# Patient Record
Sex: Male | Born: 1995 | Race: White | Hispanic: No | Marital: Single | Smoking: Never smoker
Health system: Southern US, Community
[De-identification: ages and names within clinical notes are randomized; demographics above are authoritative.]

---
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
## 2018-11-24 ENCOUNTER — Ambulatory Visit
Admission: EM | Admit: 2018-11-24 | Discharge: 2018-11-24 | Disposition: A | Discharge status: Home / Self Care | Payer: BLUE CROSS/BLUE SHIELD | Attending: Family Medicine | Admitting: Family Medicine

## 2018-11-24 DIAGNOSIS — Z7251 High risk heterosexual behavior: Secondary | ICD-10-CM

## 2018-11-24 DIAGNOSIS — Z113 Encounter for screening for infections with a predominantly sexual mode of transmission: Secondary | ICD-10-CM | POA: Diagnosis not present

## 2018-11-24 LAB — URINALYSIS, COMPLETE (UACMP) WITH MICROSCOPIC
Bacteria, UA: NONE SEEN
Bilirubin Urine: NEGATIVE
Glucose, UA: NEGATIVE mg/dL
Hgb urine dipstick: NEGATIVE
Ketones, ur: NEGATIVE mg/dL
Leukocytes,Ua: NEGATIVE
Nitrite: NEGATIVE
Protein, ur: NEGATIVE mg/dL
RBC / HPF: NONE SEEN RBC/hpf (ref 0–5)
Specific Gravity, Urine: 1.02 (ref 1.005–1.030)
WBC, UA: NONE SEEN WBC/hpf (ref 0–5)
pH: 7 (ref 5.0–8.0)

## 2018-11-24 MED ORDER — CEFTRIAXONE SODIUM 250 MG IJ SOLR
250.0000 mg | Freq: Once | INTRAMUSCULAR | Status: AC
  Administered 2018-11-24: 18:00:00 250 mg via INTRAMUSCULAR

## 2018-11-24 MED ORDER — DOXYCYCLINE HYCLATE 100 MG PO TABS: 100.0000 mg | ORAL_TABLET | Freq: Two times a day (BID) | ORAL | 0 refills | Status: AC

## 2018-11-24 NOTE — ED Triage Notes (Signed)
Pt here for std testing, has some itching of his penis along with "something" located on his penis. No urinary symptoms reported but did have unprotected sex 1 day ago and wanted to get tested. No drainage

## 2018-11-24 NOTE — ED Provider Notes (Signed)
[facility]    CSN: [id number] Arrival date & time: 11/24/18  1651      History   Chief Complaint Chief Complaint  Patient presents with  . SEXUALLY TRANSMITTED DISEASE    HPI Edward Sloan. is a 23 y.o. male.   23 yo male here for std screening after high risk sexual behavior. States he went to a "strip club" at [employer] this past weekend and had unprotected sex with several strangers. Denies any penile discharge, dysuria, hematuria.       History reviewed. No pertinent past medical history.  There are no active problems to display for this patient.   History reviewed. No pertinent surgical history.     Home Medications    Prior to Admission medications   Medication Sig Start Date End Date Taking? Authorizing Provider  doxycycline (VIBRA-TABS) 100 MG tablet Take 1 tablet (100 mg total) by mouth 2 (two) times daily. 11/24/18   Norval Gable, MD    Family History Family History  Problem Relation Age of Onset  . Healthy Mother   . Healthy Father     Social History Social History   Tobacco Use  . Smoking status: Never Smoker  . Smokeless tobacco: Never Used  Substance Use Topics  . Alcohol use: Yes  . Drug use: Never     Allergies   Azithromycin   Review of Systems Review of Systems   Physical Exam Triage Vital Signs ED Triage Vitals  Enc Vitals Group     BP 11/24/18 1710 (!) 143/87     Pulse Rate 11/24/18 1710 90     Resp 11/24/18 1710 18     Temp 11/24/18 1710 98.2 F (36.8 C)     Temp Source 11/24/18 1710 Oral     SpO2 11/24/18 1710 98 %     Weight 11/24/18 1711 125 lb (56.7 kg)     Height --      Head Circumference --      Peak Flow --      Pain Score 11/24/18 1711 0     Pain Loc --      Pain Edu? --      Excl. in [place]? --    No data found.  Updated Vital Signs BP (!) 143/87 (BP Location: Left Arm)   Pulse 90   Temp 98.2 F (36.8 C) (Oral)   Resp 18   Wt 56.7 kg   SpO2 98%   Visual Acuity Right Eye  Distance:   Left Eye Distance:   Bilateral Distance:    Right Eye Near:   Left Eye Near:    Bilateral Near:     Physical Exam Vitals signs and nursing note reviewed.  Constitutional:      General: He is not in acute distress.    Appearance: He is not toxic-appearing or diaphoretic.  Genitourinary:    Pubic Area: No rash.      Penis: Normal.      Scrotum/Testes: Normal.  Neurological:     Mental Status: He is alert.      UC Treatments / Results  Labs (all labs ordered are listed, but only abnormal results are displayed) Labs Reviewed  URINALYSIS, COMPLETE (UACMP) WITH MICROSCOPIC - Abnormal; Notable for the following components:      Result Value   Color, Urine STRAW (*)    All other components within normal limits  GC/CHLAMYDIA PROBE AMP  RPR  HIV ANTIBODY (ROUTINE TESTING W REFLEX)  EKG   Radiology No results found.  Procedures Procedures (including critical care time)  Medications Ordered in UC Medications  cefTRIAXone (ROCEPHIN) injection 250 mg (250 mg Intramuscular Given 11/24/18 1811)    Initial Impression / Assessment and Plan / UC Course  I have reviewed the triage vital signs and the nursing notes.  Pertinent labs & imaging results that were available during my care of the patient were reviewed by me and considered in my medical decision making (see chart for details).      Final Clinical Impressions(s) / UC Diagnoses   Final diagnoses:  Screening examination for STD (sexually transmitted disease)  High risk sexual behavior, unspecified type    ED Prescriptions    Medication Sig Dispense Auth. Provider   doxycycline (VIBRA-TABS) 100 MG tablet Take 1 tablet (100 mg total) by mouth 2 (two) times daily. 14 tablet Shermaine Rivet, Pamala Hurryrlando, MD      1. diagnosis reviewed with patient 2. rx as per orders above; reviewed possible side effects, interactions, risks and benefits  3. Given Rocephin 250mg  IM x 1 4. Labs as per orders (await test results)  5. Follow-up prn   Controlled Substance Prescriptions [place] Controlled Substance Registry consulted? Not Applicable   Payton Mccallumonty, Estus Krakowski, MD 11/24/18 [phone]

## 2018-11-25 LAB — RPR: RPR Ser Ql: NONREACTIVE

## 2018-11-26 LAB — HIV ANTIBODY (ROUTINE TESTING W REFLEX): HIV Screen 4th Generation wRfx: NONREACTIVE

## 2018-11-27 LAB — GC/CHLAMYDIA PROBE AMP
Chlamydia trachomatis, NAA: NEGATIVE
Neisseria Gonorrhoeae by PCR: NEGATIVE

## 2021-01-31 ENCOUNTER — Ambulatory Visit: Payer: Self-pay

## 2021-01-31 ENCOUNTER — Other Ambulatory Visit: Payer: Self-pay

## 2021-01-31 ENCOUNTER — Other Ambulatory Visit: Payer: Self-pay | Admitting: Family Medicine

## 2021-01-31 DIAGNOSIS — M25511 Pain in right shoulder: Secondary | ICD-10-CM

## 2023-01-15 IMAGING — DX DG SHOULDER 2+V*R*
3 series · 3 of 3 positions shown · non-contrast
Comparison: None.

CLINICAL DATA: Right shoulder pain after lifting heavy bag

EXAM:
RIGHT SHOULDER - 2+ VIEW

[shoulder grashey]
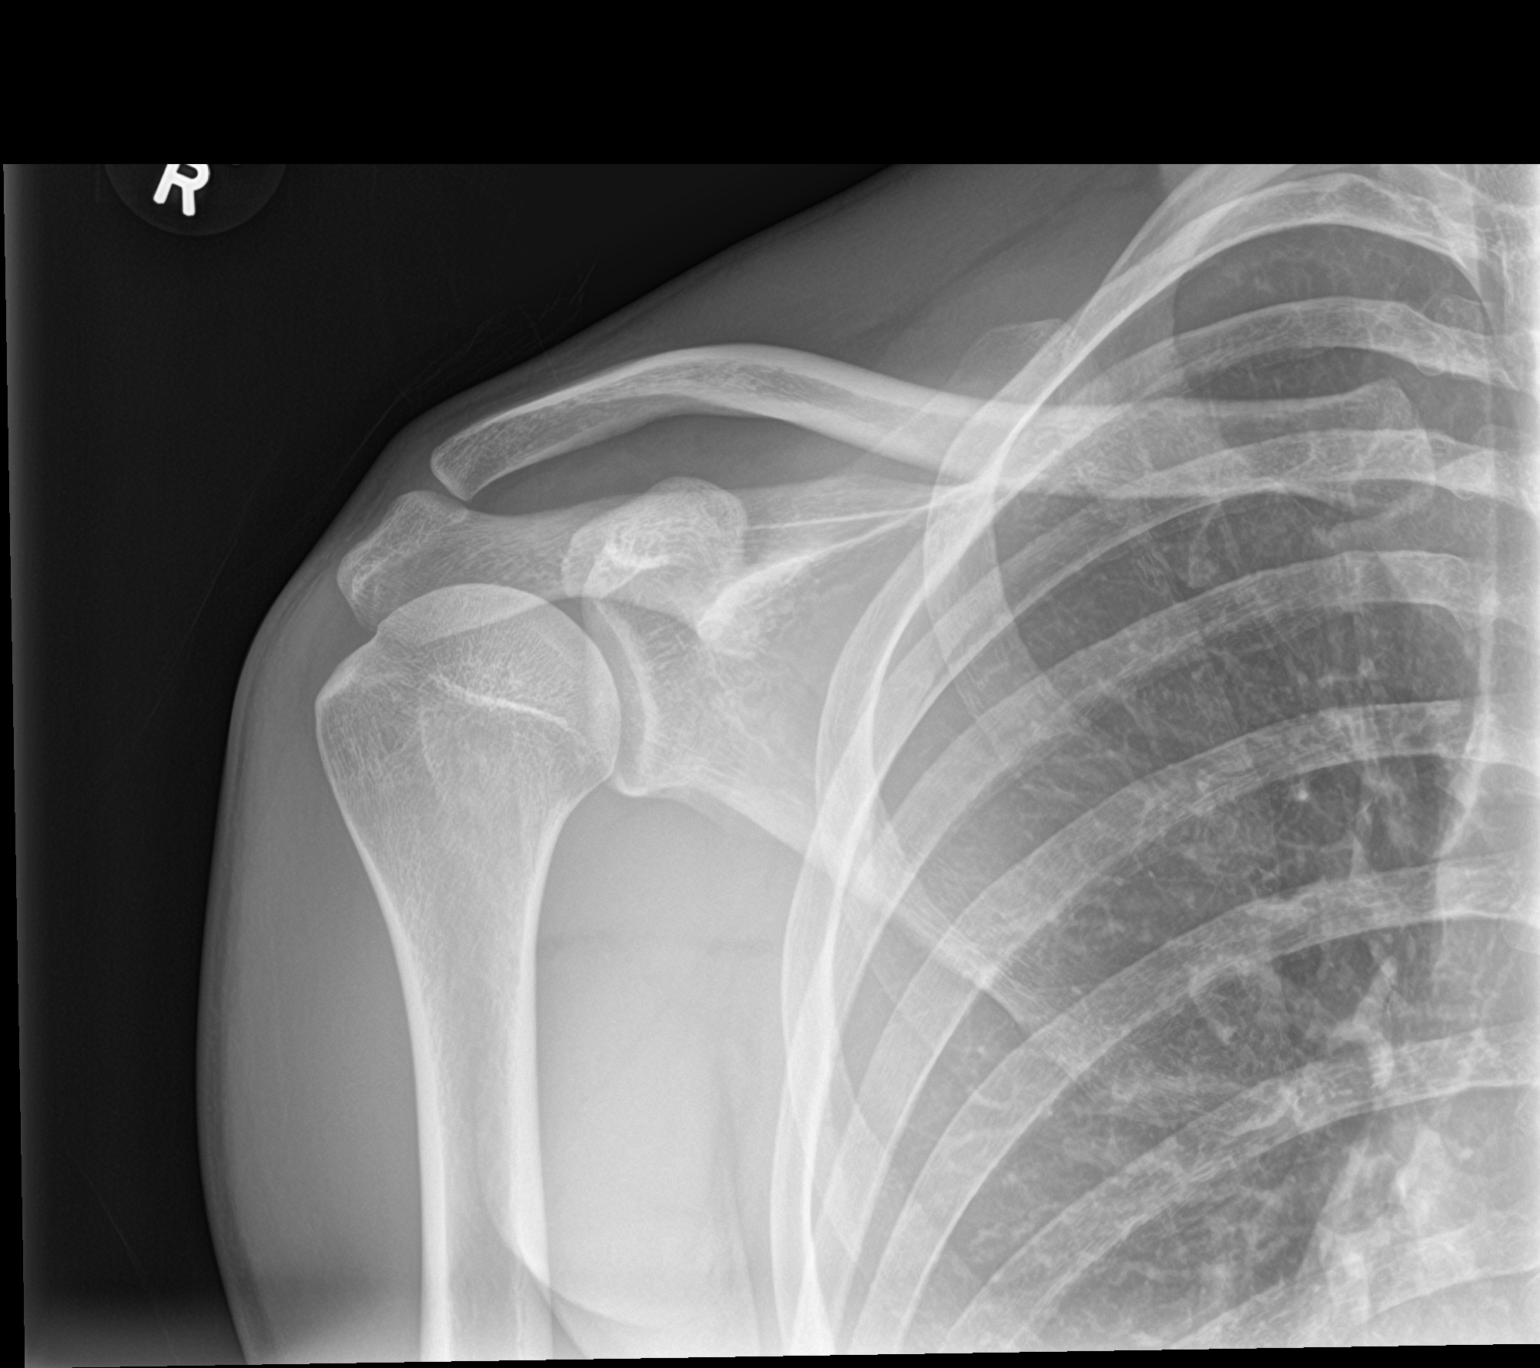

[shoulder y-view]
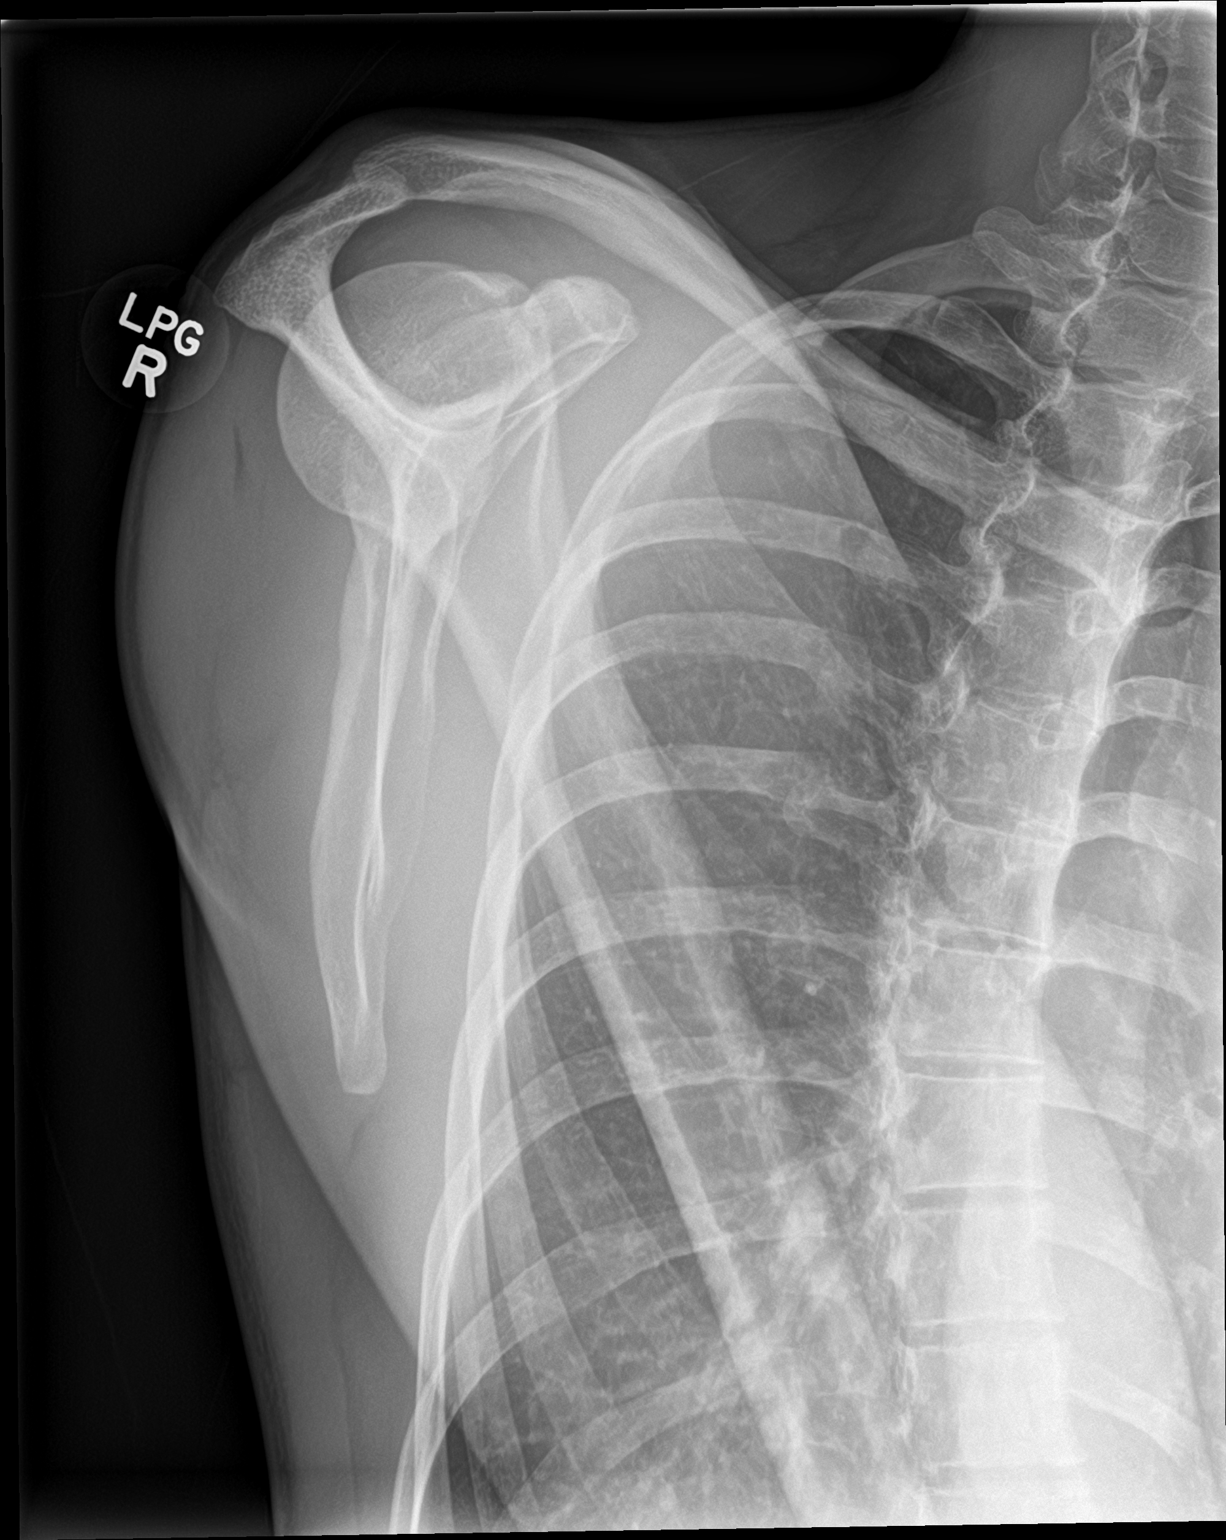

[shoulder axial]
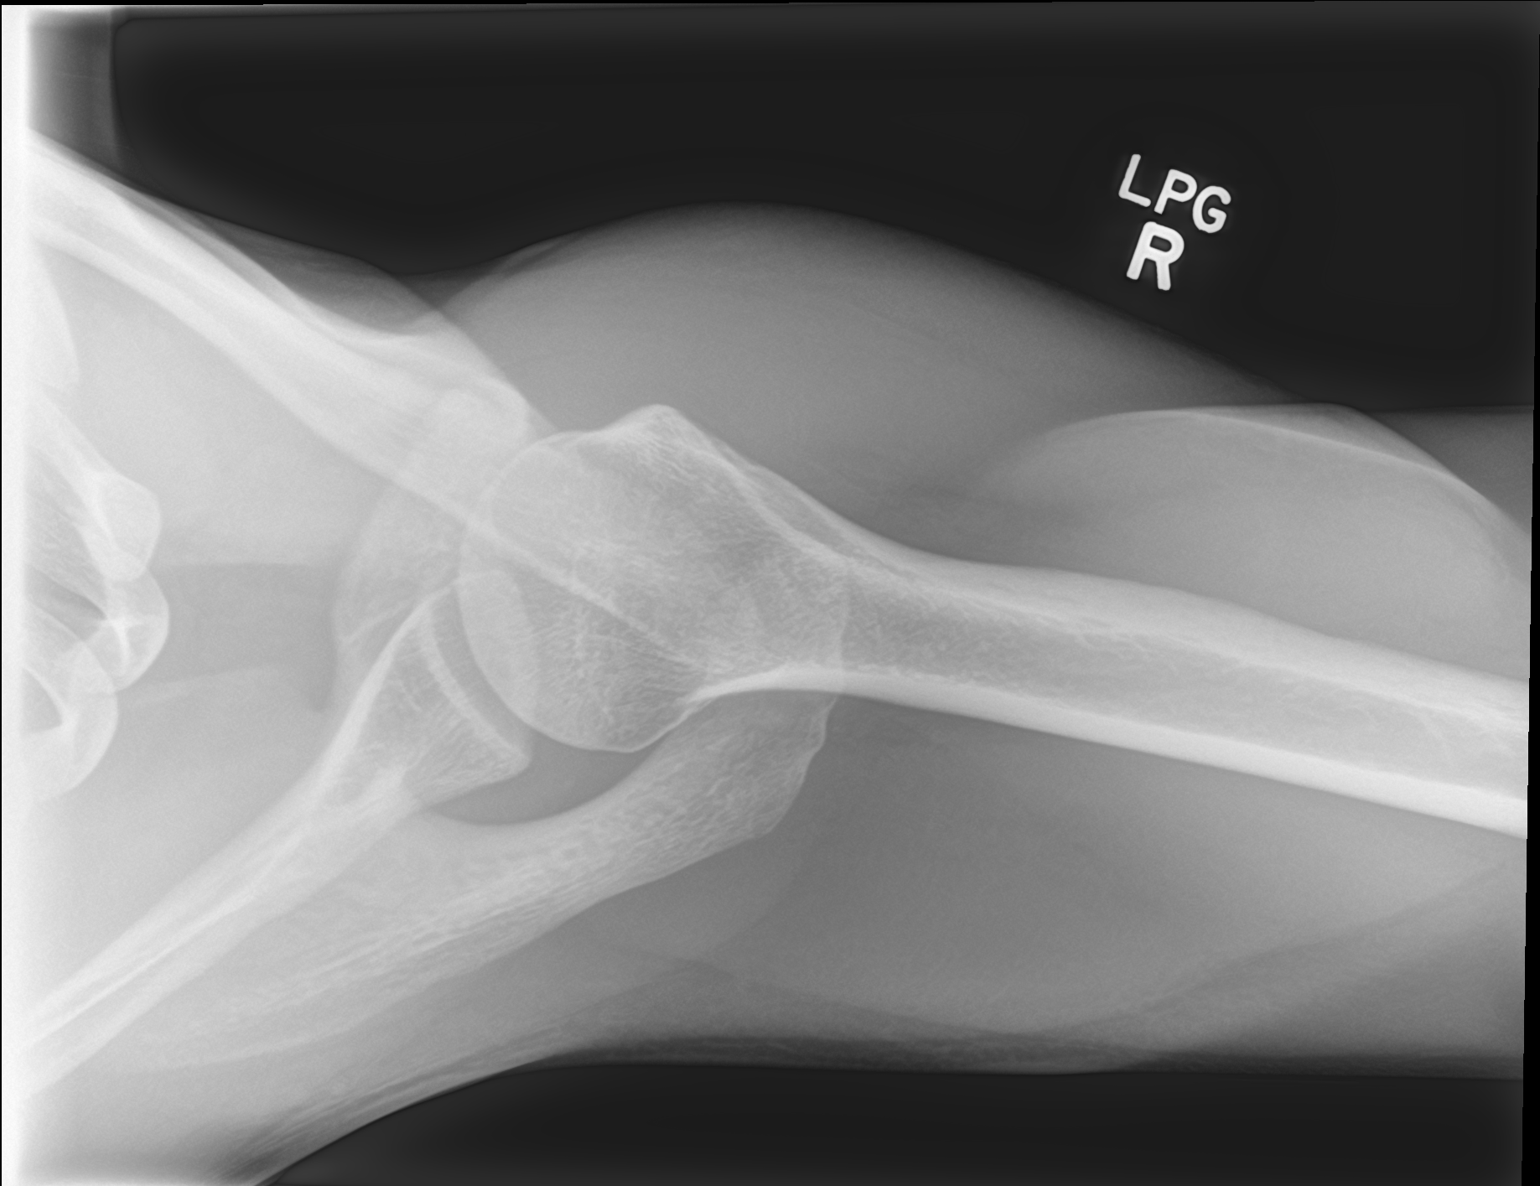

[3 of 3 positions shown; findings below may reference images not displayed]

FINDINGS: There is no evidence of fracture or dislocation. There is no
evidence of arthropathy or other focal bone abnormality. Soft
tissues are unremarkable.
IMPRESSION: Normal right shoulder radiographs.
